# Patient Record
Sex: Male | Born: 1955 | Race: White | Hispanic: No | Marital: Married | Smoking: Never smoker
Health system: Southern US, Community
[De-identification: ages and names within clinical notes are randomized; demographics above are authoritative.]

## PROBLEM LIST (undated history)

## (undated) DIAGNOSIS — K219 Gastro-esophageal reflux disease without esophagitis: Secondary | ICD-10-CM

## (undated) DIAGNOSIS — M1990 Unspecified osteoarthritis, unspecified site: Secondary | ICD-10-CM

## (undated) DIAGNOSIS — G4730 Sleep apnea, unspecified: Secondary | ICD-10-CM

## (undated) DIAGNOSIS — I10 Essential (primary) hypertension: Secondary | ICD-10-CM

## (undated) HISTORY — PX: BACK SURGERY: SHX140

---

## 2001-11-10 ENCOUNTER — Ambulatory Visit (HOSPITAL_COMMUNITY): Admission: RE | Admit: 2001-11-10 | Discharge: 2001-11-11 | Payer: Self-pay | Admitting: Neurosurgery

## 2001-11-10 ENCOUNTER — Encounter: Payer: Self-pay | Admitting: Neurosurgery

## 2004-04-11 ENCOUNTER — Ambulatory Visit (HOSPITAL_COMMUNITY): Admission: RE | Admit: 2004-04-11 | Discharge: 2004-04-12 | Payer: Self-pay | Admitting: Neurosurgery

## 2006-02-07 IMAGING — CR DG CHEST 2V
2 series · 2 of 2 positions shown · non-contrast
Comparison: none

CLINICAL DATA: HNP, spondylosis. Pre-operative chest. 
 CHEST - TWO VIEW ? 04/10/04:
 The lungs are clear and the heart and mediastinal structures are normal.

[view not recorded (1 of 2)]
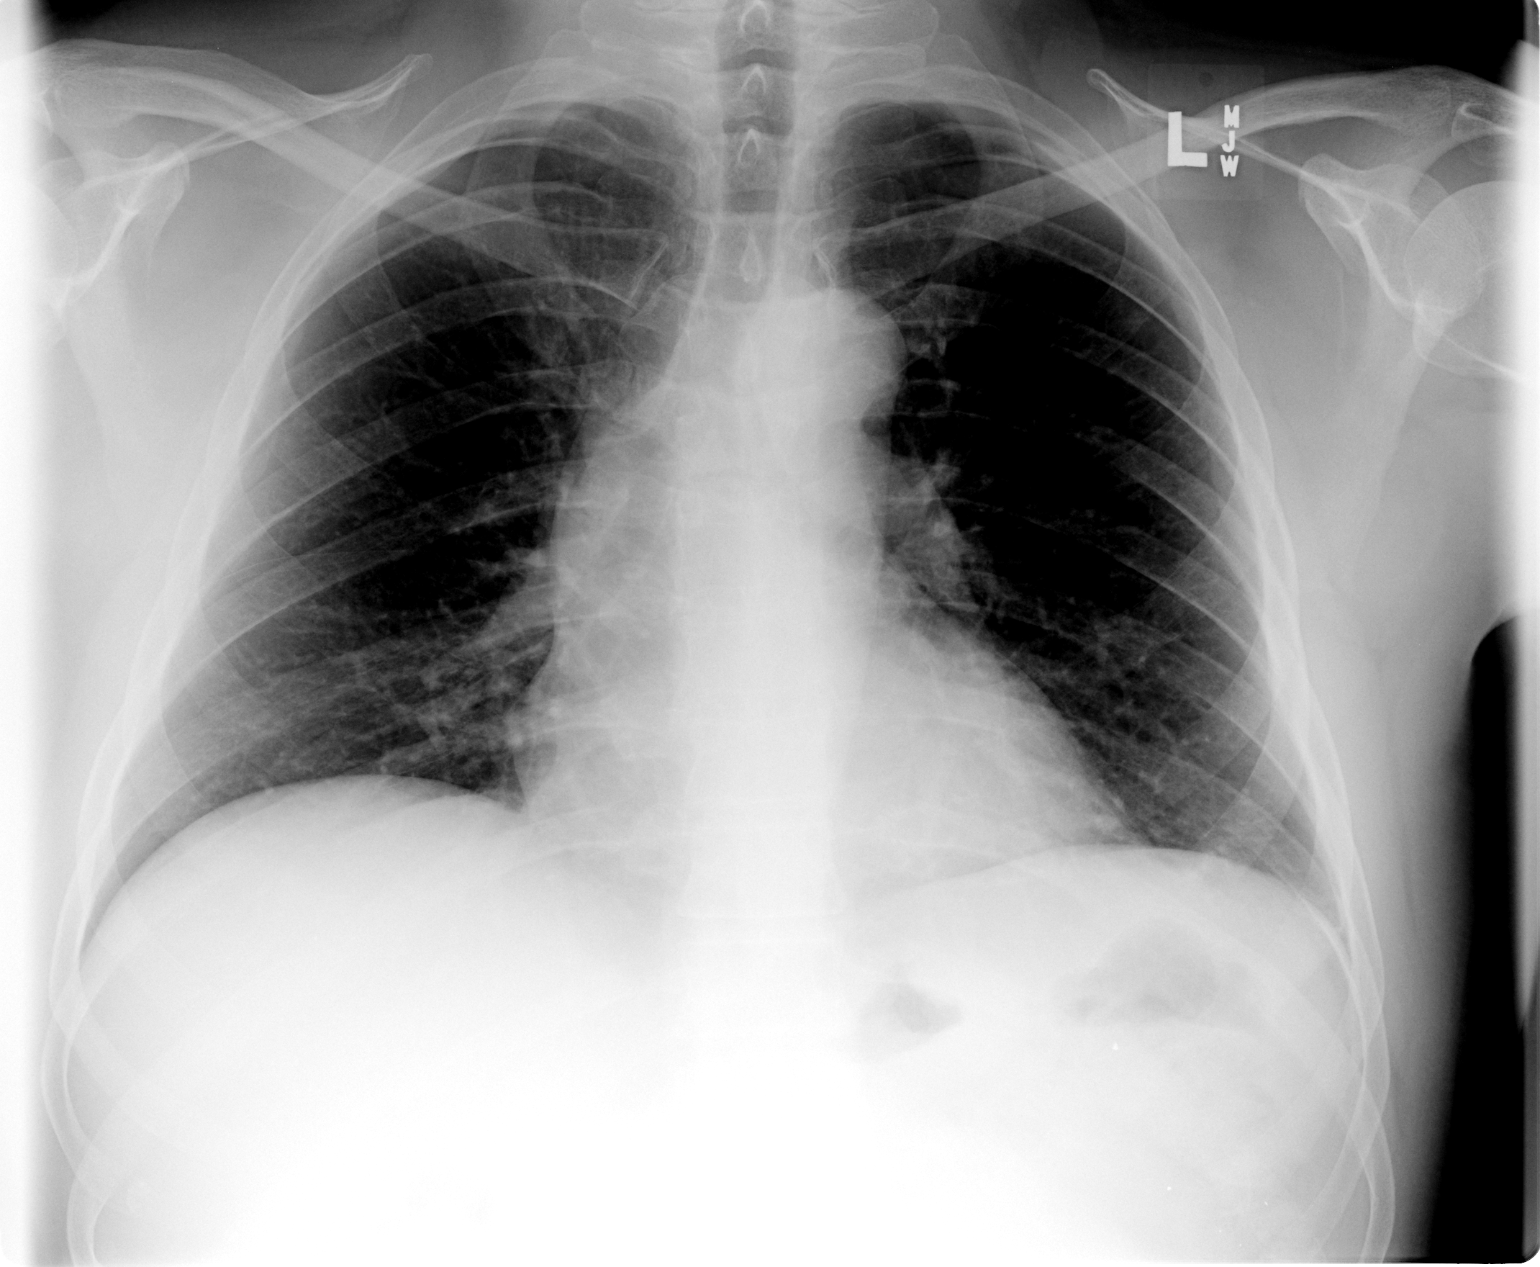

[view not recorded (2 of 2)]
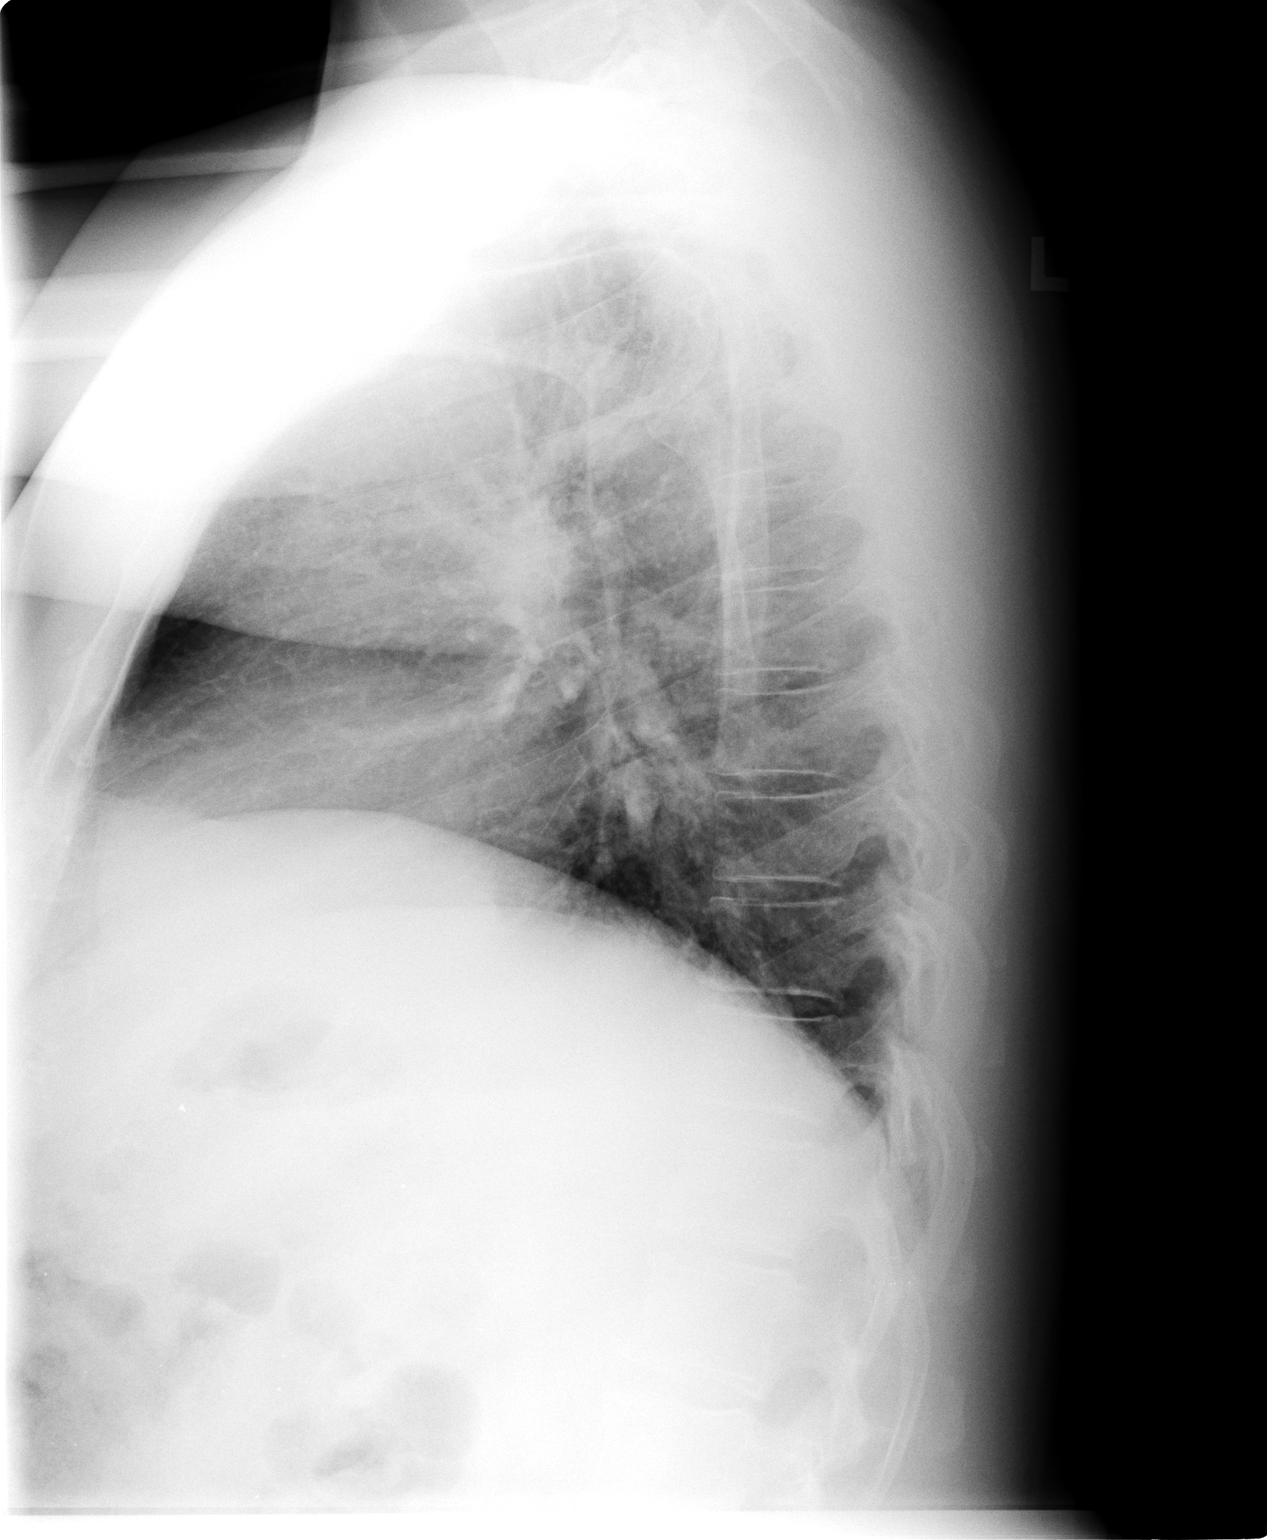

[2 of 2 positions shown; findings below may reference images not displayed]

IMPRESSION: No evidence for active chest disease.

## 2017-09-18 ENCOUNTER — Encounter (INDEPENDENT_AMBULATORY_CARE_PROVIDER_SITE_OTHER): Payer: Self-pay | Admitting: *Deleted

## 2017-10-15 ENCOUNTER — Encounter (INDEPENDENT_AMBULATORY_CARE_PROVIDER_SITE_OTHER): Payer: Self-pay | Admitting: *Deleted

## 2017-10-17 ENCOUNTER — Other Ambulatory Visit (INDEPENDENT_AMBULATORY_CARE_PROVIDER_SITE_OTHER): Payer: Self-pay | Admitting: *Deleted

## 2017-10-17 DIAGNOSIS — Z1211 Encounter for screening for malignant neoplasm of colon: Secondary | ICD-10-CM | POA: Insufficient documentation

## 2017-12-17 ENCOUNTER — Telehealth (INDEPENDENT_AMBULATORY_CARE_PROVIDER_SITE_OTHER): Payer: Self-pay | Admitting: *Deleted

## 2017-12-17 ENCOUNTER — Encounter (INDEPENDENT_AMBULATORY_CARE_PROVIDER_SITE_OTHER): Payer: Self-pay | Admitting: *Deleted

## 2017-12-17 NOTE — Telephone Encounter (Signed)
Patient needs suprep 

## 2017-12-18 MED ORDER — SUPREP BOWEL PREP KIT 17.5-3.13-1.6 GM/177ML PO SOLN: 1.0000 | Freq: Once | ORAL | 0 refills | Status: AC

## 2018-02-19 ENCOUNTER — Encounter (INDEPENDENT_AMBULATORY_CARE_PROVIDER_SITE_OTHER): Payer: Self-pay | Admitting: *Deleted

## 2018-03-10 ENCOUNTER — Telehealth (INDEPENDENT_AMBULATORY_CARE_PROVIDER_SITE_OTHER): Payer: Self-pay | Admitting: *Deleted

## 2018-03-10 NOTE — Telephone Encounter (Signed)
Referring MD/PCP: milam   Procedure: tcs  Reason/Indication:  screening  Has patient had this procedure before?  no  If so, when, by whom and where?    Is there a family history of colon cancer?  no  Who?  What age when diagnosed?    Is patient diabetic?   no      Does patient have prosthetic heart valve or mechanical valve?  no  Do you have a pacemaker?  no  Has patient ever had endocarditis? no  Has patient had joint replacement within last 12 months?  no  Is patient constipated or do they take laxatives? no  Does patient have a history of alcohol/drug use?  no  Is patient on blood thinner such as Coumadin, Plavix and/or Aspirin? no  Medications: hctz 25 mg daily, amlodipine 5 mg daily, carvedilol 12.5 mg daily, omeprazole 20 mg daily  Allergies: nkda  Medication Adjustment per Dr Lindi Adie, NP:   Procedure date & time: 04/10/18 at 730

## 2018-03-13 NOTE — Telephone Encounter (Signed)
agree

## 2018-04-10 ENCOUNTER — Encounter (HOSPITAL_COMMUNITY)
Admission: RE | Disposition: A | Discharge status: Home / Self Care | Payer: Self-pay | Source: Home / Self Care | Attending: Internal Medicine

## 2018-04-10 ENCOUNTER — Other Ambulatory Visit: Payer: Self-pay

## 2018-04-10 ENCOUNTER — Ambulatory Visit (HOSPITAL_COMMUNITY)
Admission: RE | Admit: 2018-04-10 | Discharge: 2018-04-10 | Disposition: A | Discharge status: Home / Self Care | Payer: BLUE CROSS/BLUE SHIELD | Attending: Internal Medicine | Admitting: Internal Medicine

## 2018-04-10 ENCOUNTER — Encounter (HOSPITAL_COMMUNITY): Payer: Self-pay | Admitting: *Deleted

## 2018-04-10 DIAGNOSIS — D120 Benign neoplasm of cecum: Secondary | ICD-10-CM | POA: Insufficient documentation

## 2018-04-10 DIAGNOSIS — K219 Gastro-esophageal reflux disease without esophagitis: Secondary | ICD-10-CM | POA: Insufficient documentation

## 2018-04-10 DIAGNOSIS — Z79899 Other long term (current) drug therapy: Secondary | ICD-10-CM | POA: Insufficient documentation

## 2018-04-10 DIAGNOSIS — I10 Essential (primary) hypertension: Secondary | ICD-10-CM | POA: Insufficient documentation

## 2018-04-10 DIAGNOSIS — G4730 Sleep apnea, unspecified: Secondary | ICD-10-CM | POA: Diagnosis not present

## 2018-04-10 DIAGNOSIS — K648 Other hemorrhoids: Secondary | ICD-10-CM | POA: Insufficient documentation

## 2018-04-10 DIAGNOSIS — Z1211 Encounter for screening for malignant neoplasm of colon: Secondary | ICD-10-CM | POA: Diagnosis present

## 2018-04-10 DIAGNOSIS — K644 Residual hemorrhoidal skin tags: Secondary | ICD-10-CM | POA: Diagnosis not present

## 2018-04-10 DIAGNOSIS — D123 Benign neoplasm of transverse colon: Secondary | ICD-10-CM | POA: Insufficient documentation

## 2018-04-10 DIAGNOSIS — D122 Benign neoplasm of ascending colon: Secondary | ICD-10-CM | POA: Insufficient documentation

## 2018-04-10 HISTORY — DX: Gastro-esophageal reflux disease without esophagitis: K21.9

## 2018-04-10 HISTORY — DX: Sleep apnea, unspecified: G47.30

## 2018-04-10 HISTORY — DX: Unspecified osteoarthritis, unspecified site: M19.90

## 2018-04-10 HISTORY — PX: COLONOSCOPY: SHX5424

## 2018-04-10 HISTORY — PX: POLYPECTOMY: SHX5525

## 2018-04-10 HISTORY — DX: Essential (primary) hypertension: I10

## 2018-04-10 SURGERY — COLONOSCOPY
Anesthesia: Moderate Sedation

## 2018-04-10 MED ORDER — MIDAZOLAM HCL 5 MG/5ML IJ SOLN
INTRAMUSCULAR | Status: AC
  Filled 2018-04-10: qty 10

## 2018-04-10 MED ORDER — MEPERIDINE HCL 50 MG/ML IJ SOLN
INTRAMUSCULAR | Status: DC | PRN
  Administered 2018-04-10 (×2): 25 mg via INTRAVENOUS

## 2018-04-10 MED ORDER — SODIUM CHLORIDE 0.9 % IV SOLN
INTRAVENOUS | Status: DC
  Administered 2018-04-10: 1000 mL via INTRAVENOUS

## 2018-04-10 MED ORDER — MIDAZOLAM HCL 5 MG/5ML IJ SOLN
INTRAMUSCULAR | Status: AC
  Filled 2018-04-10: qty 5

## 2018-04-10 MED ORDER — MIDAZOLAM HCL 5 MG/5ML IJ SOLN
INTRAMUSCULAR | Status: DC | PRN
  Administered 2018-04-10 (×2): 1 mg via INTRAVENOUS
  Administered 2018-04-10: 2 mg via INTRAVENOUS
  Administered 2018-04-10 (×3): 1 mg via INTRAVENOUS
  Administered 2018-04-10 (×2): 2 mg via INTRAVENOUS

## 2018-04-10 MED ORDER — STERILE WATER FOR IRRIGATION IR SOLN
Status: DC | PRN
  Administered 2018-04-10: 08:00:00

## 2018-04-10 MED ORDER — MEPERIDINE HCL 50 MG/ML IJ SOLN
INTRAMUSCULAR | Status: AC
  Filled 2018-04-10: qty 1

## 2018-04-10 NOTE — H&P (Signed)
Bryan Trujillo is an 63 y.o. male.   Chief Complaint: Patient is here for colonoscopy. HPI: Patient 63 year old Caucasian male was here for  screening colonoscopy.  Last exam was normal 10 years ago.  He denies abdominal pain change in bowel habits or rectal bleeding.  Family history is negative for CRC. He does not take on regular basis.  Past Medical History:  Diagnosis Date  . Arthritis    generalized  . GERD (gastroesophageal reflux disease)   . Hypertension   . Sleep apnea     Past Surgical History:  Procedure Laterality Date  . BACK SURGERY      History reviewed. No pertinent family history. Social History:  reports that he has never smoked. He has never used smokeless tobacco. He reports current alcohol use. He reports that he does not use drugs.  Allergies:  Allergies  Allergen Reactions  . Adhesive [Tape] Rash    Medications Prior to Admission  Medication Sig Dispense Refill  . amLODipine (NORVASC) 5 MG tablet Take 5 mg by mouth daily.    . carvedilol (COREG) 12.5 MG tablet Take 12.5 mg by mouth 2 (two) times daily with a meal.    . hydrochlorothiazide (HYDRODIURIL) 25 MG tablet Take 25 mg by mouth daily.    Marland Kitchen ibuprofen (ADVIL,MOTRIN) 200 MG tablet Take 800 mg by mouth every 6 (six) hours as needed for moderate pain.    . Melatonin 10 MG CAPS Take 10 mg by mouth at bedtime.    . Multiple Vitamin (MULTIVITAMIN WITH MINERALS) TABS tablet Take 2 tablets by mouth at bedtime.    Marland Kitchen omeprazole (PRILOSEC) 20 MG capsule Take 20 mg by mouth See admin instructions. Take 20 mg at night may take an additional 20 mg dose as needed for heartburn    . TURMERIC PO Take 1 tablet by mouth daily.      No results found for this or any previous visit (from the past 48 hour(s)). No results found.  ROS  Blood pressure (!) 133/98, pulse 62, temperature 97.9 F (36.6 C), temperature source Oral, resp. rate 15, SpO2 100 %. Physical Exam  Constitutional: He appears well-developed and  well-nourished.  HENT:  Mouth/Throat: Oropharynx is clear and moist.  Eyes: Conjunctivae are normal. No scleral icterus.  Neck: No thyromegaly present.  Cardiovascular: Normal rate, regular rhythm and normal heart sounds.  No murmur heard. Respiratory: Effort normal and breath sounds normal.  GI: He exhibits no distension and no mass. There is no abdominal tenderness.  Musculoskeletal:        General: No edema.  Lymphadenopathy:    He has no cervical adenopathy.  Neurological: He is alert.  Skin: Skin is warm and dry.     Assessment/Plan Average her screening colonoscopy.  Hildred Laser, MD 04/10/2018, 7:33 AM

## 2018-04-10 NOTE — Op Note (Signed)
[facility] Patient Name: Bryan Trujillo Procedure Date: 04/10/2018 7:16 AM MRN: [id number] Date of Birth: 1955-03-22 Attending MD: Hildred Laser , MD CSN: [id number] Age: 63 Admit Type: Outpatient Procedure:                Colonoscopy Indications:              Screening for colorectal malignant neoplasm Providers:                Hildred Laser, MD, Otis Peak B. Sharon Seller, RN, Gerome Sam, RN, Randa Spike, Technician Referring MD:             Quentin Cornwall, MD Medicines:                Meperidine 50 mg IV, Midazolam 11 mg IV Complications:            No immediate complications. Estimated Blood Loss:     Estimated blood loss was minimal. Procedure:                Pre-Anesthesia Assessment:                           - Prior to the procedure, a History and Physical                            was performed, and patient medications and                            allergies were reviewed. The patient's tolerance of                            previous anesthesia was also reviewed. The risks                            and benefits of the procedure and the sedation                            options and risks were discussed with the patient.                            All questions were answered, and informed consent                            was obtained. Prior Anticoagulants: The patient has                            taken no previous anticoagulant or antiplatelet                            agents. ASA Grade Assessment: II - A patient with                            mild systemic disease. After reviewing the risks  and benefits, the patient was deemed in                            satisfactory condition to undergo the procedure.                           After obtaining informed consent, the colonoscope                            was passed under direct vision. Throughout the                            procedure, the patient's  blood pressure, pulse, and                            oxygen saturations were monitored continuously. The                            [id number]-H190DL ([id number]) scope was introduced through                            the anus and advanced to the the cecum, identified                            by appendiceal orifice and ileocecal valve. The                            colonoscopy was performed without difficulty. The                            patient tolerated the procedure well. The quality                            of the bowel preparation was good. The ileocecal                            valve, appendiceal orifice, and rectum were                            photographed. Scope In: 7:44:20 AM Scope Out: 8:16:55 AM Scope Withdrawal Time: 0 hours 19 minutes 10 seconds  Total Procedure Duration: 0 hours 32 minutes 35 seconds  Findings:      Skin tags were found on perianal exam.      A 7 mm polyp was found in the cecum. The polyp was semi-sessile. The       polyp was removed with a cold snare. Resection and retrieval were       complete. The pathology specimen was placed into Bottle Number 2.      A small polyp was found in the cecum. The polyp was sessile. Biopsies       were taken with a cold forceps for histology. The pathology specimen was       placed into Bottle Number 2.      Six sessile polyps were found in the transverse colon and ascending  colon. The polyps were small in size. These polyps were removed with a       cold snare. Resection and retrieval were complete. The pathology       specimen was placed into Bottle Number 1.      A small polyp was found in the ascending colon. The polyp was sessile.       Biopsies were taken with a cold forceps for histology. The pathology       specimen was placed into Bottle Number 1.      External and internal hemorrhoids were found during retroflexion. The       hemorrhoids were medium-sized. Impression:               - Perianal skin  tags found on perianal exam.                           - One 7 mm polyp in the cecum, removed with a cold                            snare. Resected and retrieved.                           - One small polyp in the cecum. Biopsied.                           - Six small polyps in the transverse colon and in                            the ascending colon, removed with a cold snare.                            Resected and retrieved.                           - One small polyp in the ascending colon. Biopsied.                           - External and internal hemorrhoids. Moderate Sedation:      Moderate (conscious) sedation was administered by the endoscopy nurse       and supervised by the endoscopist. The following parameters were       monitored: oxygen saturation, heart rate, blood pressure, CO2       capnography and response to care. Total physician intraservice time was       39 minutes. Recommendation:           - Patient has a contact number available for                            emergencies. The signs and symptoms of potential                            delayed complications were discussed with the                            patient. Return to normal activities tomorrow.  Written discharge instructions were provided to the                            patient.                           - Resume previous diet today.                           - Continue present medications.                           - No aspirin, ibuprofen, naproxen, or other                            non-steroidal anti-inflammatory drugs for 3 days.                           - Await pathology results.                           - Repeat colonoscopy in 3 years for surveillance. Procedure Code(s):        --- Professional ---                           [phone], Colonoscopy, flexible; with removal of                            tumor(s), polyp(s), or other lesion(s) by snare                             technique                           45380, 59, Colonoscopy, flexible; with biopsy,                            single or multiple                           99153, Moderate sedation; each additional 15                            minutes intraservice time                           99153, Moderate sedation; each additional 15                            minutes intraservice time                           G0500, Moderate sedation services provided by the                            same physician or other qualified health care  professional performing a gastrointestinal                            endoscopic service that sedation supports,                            requiring the presence of an independent trained                            observer to assist in the monitoring of the                            patient's level of consciousness and physiological                            status; initial 15 minutes of intra-service time;                            patient age 15 years or older (additional time may                            be reported with [phone], as appropriate) Diagnosis Code(s):        --- Professional ---                           Z12.11, Encounter for screening for malignant                            neoplasm of colon                           D12.0, Benign neoplasm of cecum                           D12.2, Benign neoplasm of ascending colon                           D12.3, Benign neoplasm of transverse colon (hepatic                            flexure or splenic flexure)                           K64.4, Residual hemorrhoidal skin tags                           K64.8, Other hemorrhoids CPT copyright 2018 American [employer]. All rights reserved. The codes documented in this report are preliminary and upon coder review may  be revised to meet current compliance requirements. Hildred Laser, MD Hildred Laser, MD 04/10/2018 8:30:04 AM This  report has been signed electronically. Number of Addenda: 0

## 2018-04-10 NOTE — Discharge Instructions (Signed)
No aspirin or NSAIDs for 72 hours. Resume other medications as before. Resume usual diet. No driving for 24 hours. Physician will call with biopsy results.   Colonoscopy, Adult, Care After This sheet gives you information about how to care for yourself after your procedure. Your doctor may also give you more specific instructions. If you have problems or questions, call your doctor. What can I expect after the procedure? After the procedure, it is common to have:  A small amount of blood in your poop for 24 hours.  Some gas.  Mild cramping or bloating in your belly. Follow these instructions at home: General instructions  For the first 24 hours after the procedure: ? Do not drive or use machinery. ? Do not sign important documents. ? Do not drink alcohol. ? Do your daily activities more slowly than normal. ? Eat foods that are soft and easy to digest.  Take over-the-counter or prescription medicines only as told by your doctor. To help cramping and bloating:   Try walking around.  Put heat on your belly (abdomen) as told by your doctor. Use a heat source that your doctor recommends, such as a moist heat pack or a heating pad. ? Put a towel between your skin and the heat source. ? Leave the heat on for 20-30 minutes. ? Remove the heat if your skin turns bright red. This is especially important if you cannot feel pain, heat, or cold. You can get burned. Eating and drinking   Drink enough fluid to keep your pee (urine) clear or pale yellow.  Return to your normal diet as told by your doctor. Avoid heavy or fried foods that are hard to digest.  Avoid drinking alcohol for as long as told by your doctor. Contact a doctor if:  You have blood in your poop (stool) 2-3 days after the procedure. Get help right away if:  You have more than a small amount of blood in your poop.  You see large clumps of tissue (blood clots) in your poop.  Your belly is swollen.  You feel  sick to your stomach (nauseous).  You throw up (vomit).  You have a fever.  You have belly pain that gets worse, and medicine does not help your pain. Summary  After the procedure, it is common to have a small amount of blood in your poop. You may also have mild cramping and bloating in your belly.  For the first 24 hours after the procedure, do not drive or use machinery, do not sign important documents, and do not drink alcohol.  Get help right away if you have a lot of blood in your poop, feel sick to your stomach, have a fever, or have more belly pain. This information is not intended to replace advice given to you by your health care provider. Make sure you discuss any questions you have with your health care provider. Document Released: 04/07/2010 Document Revised: 01/03/2017 Document Reviewed: 11/28/2015 Elsevier Interactive Patient Education  2019 [employer]   Hemorrhoids Hemorrhoids are swollen veins that may develop:  In the butt (rectum). These are called internal hemorrhoids.  Around the opening of the butt (anus). These are called external hemorrhoids. Hemorrhoids can cause pain, itching, or bleeding. Most of the time, they do not cause serious problems. They usually get better with diet changes, lifestyle changes, and other home treatments. What are the causes? This condition may be caused by:  Having trouble pooping (constipation).  Pushing hard (straining) to poop.  Watery poop (diarrhea).  Pregnancy.  Being very overweight (obese).  Sitting for long periods of time.  Heavy lifting or other activity that causes you to strain.  Anal sex.  Riding a bike for a long period of time. What are the signs or symptoms? Symptoms of this condition include:  Pain.  Itching or soreness in the butt.  Bleeding from the butt.  Leaking poop.  Swelling in the area.  One or more lumps around the opening of your butt. How is this diagnosed? A doctor can  often diagnose this condition by looking at the affected area. The doctor may also:  Do an exam that involves feeling the area with a gloved hand (digital rectal exam).  Examine the area inside your butt using a small tube (anoscope).  Order blood tests. This may be done if you have lost a lot of blood.  Have you get a test that involves looking inside the colon using a flexible tube with a camera on the end (sigmoidoscopy or colonoscopy). How is this treated? This condition can usually be treated at home. Your doctor may tell you to change what you eat, make lifestyle changes, or try home treatments. If these do not help, procedures can be done to remove the hemorrhoids or make them smaller. These may involve:  Placing rubber bands at the base of the hemorrhoids to cut off their blood supply.  Injecting medicine into the hemorrhoids to shrink them.  Shining a type of light energy onto the hemorrhoids to cause them to fall off.  Doing surgery to remove the hemorrhoids or cut off their blood supply. Follow these instructions at home: Eating and drinking   Eat foods that have a lot of fiber in them. These include whole grains, beans, nuts, fruits, and vegetables.  Ask your doctor about taking products that have added fiber (fibersupplements).  Reduce the amount of fat in your diet. You can do this by: ? Eating low-fat dairy products. ? Eating less red meat. ? Avoiding processed foods.  Drink enough fluid to keep your pee (urine) pale yellow. Managing pain and swelling   Take a warm-water bath (sitz bath) for 20 minutes to ease pain. Do this 3-4 times a day. You may do this in a bathtub or using a portable sitz bath that fits over the toilet.  If told, put ice on the painful area. It may be helpful to use ice between your warm baths. ? Put ice in a plastic bag. ? Place a towel between your skin and the bag. ? Leave the ice on for 20 minutes, 2-3 times a day. General  instructions  Take over-the-counter and prescription medicines only as told by your doctor. ? Medicated creams and medicines may be used as told.  Exercise often. Ask your doctor how much and what kind of exercise is best for you.  Go to the bathroom when you have the urge to poop. Do not wait.  Avoid pushing too hard when you poop.  Keep your butt dry and clean. Use wet toilet paper or moist towelettes after pooping.  Do not sit on the toilet for a long time.  Keep all follow-up visits as told by your doctor. This is important. Contact a doctor if you:  Have pain and swelling that do not get better with treatment or medicine.  Have trouble pooping.  Cannot poop.  Have pain or swelling outside the area of the hemorrhoids. Get help right away if you have:  Bleeding that will not stop. Summary  Hemorrhoids are swollen veins in the butt or around the opening of the butt.  They can cause pain, itching, or bleeding.  Eat foods that have a lot of fiber in them. These include whole grains, beans, nuts, fruits, and vegetables.  Take a warm-water bath (sitz bath) for 20 minutes to ease pain. Do this 3-4 times a day. This information is not intended to replace advice given to you by your health care provider. Make sure you discuss any questions you have with your health care provider. Document Released: 12/13/2007 Document Revised: 07/25/2017 Document Reviewed: 07/25/2017 Elsevier Interactive Patient Education  2019 [place] POST-ANESTHESIA  IMMEDIATELY FOLLOWING SURGERY:  Do not drive or operate machinery for the first twenty four hours after surgery.  Do not make any important decisions for twenty four hours after surgery or while taking narcotic pain medications or sedatives.  If you develop intractable nausea and vomiting or a severe headache please notify your doctor immediately.  FOLLOW-UP:  Please make an appointment with your surgeon as  instructed. You do not need to follow up with anesthesia unless specifically instructed to do so.  WOUND CARE INSTRUCTIONS (if applicable):  Keep a dry clean dressing on the anesthesia/puncture wound site if there is drainage.  Once the wound has quit draining you may leave it open to air.  Generally you should leave the bandage intact for twenty four hours unless there is drainage.  If the epidural site drains for more than 36-48 hours please call the anesthesia department.  QUESTIONS?:  Please feel free to call your physician or the hospital operator if you have any questions, and they will be happy to assist you.

## 2018-04-14 ENCOUNTER — Encounter (HOSPITAL_COMMUNITY): Payer: Self-pay | Admitting: Internal Medicine

## 2021-03-14 ENCOUNTER — Encounter (INDEPENDENT_AMBULATORY_CARE_PROVIDER_SITE_OTHER): Payer: Self-pay | Admitting: *Deleted

## 2023-12-03 ENCOUNTER — Telehealth (INDEPENDENT_AMBULATORY_CARE_PROVIDER_SITE_OTHER): Payer: Self-pay

## 2023-12-03 NOTE — Telephone Encounter (Signed)
Ok to schedule.  Room : any   Thanks,  Vista Lawman, MD Gastroenterology and Hepatology [facility]

## 2023-12-03 NOTE — Telephone Encounter (Signed)
 ATC pt, no answer. LVM for call back.

## 2023-12-03 NOTE — Telephone Encounter (Signed)
 Who is your primary care physician: Dr. Lynwood IVAR Moll  Reasons for the colonoscopy: history colon polyps  Have you had a colonoscopy before?  Yes, 2019  Do you have family history of colon cancer? no  Previous colonoscopy with polyps removed? yes  Do you have a history colorectal cancer?   no  Are you diabetic? If yes, Type 1 or Type 2?    Yes, type 2  Do you have a prosthetic or mechanical heart valve? no  Do you have a pacemaker/defibrillator?   no  Have you had endocarditis/atrial fibrillation? no  Have you had joint replacement within the last 12 months?  no  Do you tend to be constipated or have to use laxatives? no  Do you have any history of drugs or alchohol?  no  Do you use supplemental oxygen?  no  Have you had a stroke or heart attack within the last 6 months? no  Do you take weight loss medication?  no  Do you take any blood-thinning medications such as: (aspirin, warfarin, Plavix, Aggrenox)  no  If yes we need the name, milligram, dosage and who is prescribing doctor  Current Outpatient Medications on File Prior to Visit  Medication Sig Dispense Refill   amLODipine (NORVASC) 5 MG tablet Take 5 mg by mouth daily.     carvedilol (COREG) 12.5 MG tablet Take 12.5 mg by mouth 2 (two) times daily with a meal.     hydrochlorothiazide (HYDRODIURIL) 25 MG tablet Take 25 mg by mouth daily.     omeprazole (PRILOSEC) 20 MG capsule Take 20 mg by mouth See admin instructions. Take 20 mg at night may take an additional 20 mg dose as needed for heartburn     rosuvastatin (CRESTOR) 10 MG tablet Take 10 mg by mouth daily.     sitaGLIPtin-metformin (JANUMET) 50-1000 MG tablet Take 1 tablet by mouth 2 (two) times daily with a meal.     No current facility-administered medications on file prior to visit.    Allergies  Allergen Reactions   Adhesive [Tape] Rash     Pharmacy: [facility]  Primary Insurance Name: [facility]  Best number where you can be reached:  [phone] or [phone]

## 2023-12-04 NOTE — Telephone Encounter (Signed)
 Patient requested we call with our November schedule.

## 2023-12-19 NOTE — Telephone Encounter (Signed)
 ATC pt, no answer. LVM for call back.

## 2024-01-14 ENCOUNTER — Encounter: Payer: Self-pay | Admitting: *Deleted

## 2024-01-14 ENCOUNTER — Other Ambulatory Visit: Payer: Self-pay | Admitting: *Deleted

## 2024-01-14 MED ORDER — PEG 3350-KCL-NA BICARB-NACL 420 G PO SOLR: 4000.0000 mL | Freq: Once | ORAL | 0 refills | Status: AC

## 2024-01-14 NOTE — Telephone Encounter (Signed)
 Pt has been scheduled for 01/31/24. Instructions was given to pt and prep sent to pharmacy.

## 2024-01-15 NOTE — Telephone Encounter (Signed)
 Questionnaire from recall, no referral needed

## 2024-01-27 ENCOUNTER — Encounter (HOSPITAL_COMMUNITY)
Admission: RE | Admit: 2024-01-27 | Discharge: 2024-01-27 | Disposition: A | Discharge status: Home / Self Care | Source: Ambulatory Visit | Attending: Gastroenterology | Admitting: Gastroenterology

## 2024-01-28 ENCOUNTER — Encounter (HOSPITAL_COMMUNITY): Payer: Self-pay

## 2024-01-28 ENCOUNTER — Other Ambulatory Visit: Payer: Self-pay

## 2024-01-31 ENCOUNTER — Ambulatory Visit (HOSPITAL_COMMUNITY): Admitting: Anesthesiology

## 2024-01-31 ENCOUNTER — Ambulatory Visit (HOSPITAL_COMMUNITY)
Admission: RE | Admit: 2024-01-31 | Discharge: 2024-01-31 | Disposition: A | Discharge status: Home / Self Care | Attending: Gastroenterology | Admitting: Gastroenterology

## 2024-01-31 ENCOUNTER — Encounter (HOSPITAL_COMMUNITY): Payer: Self-pay | Admitting: Gastroenterology

## 2024-01-31 ENCOUNTER — Encounter (HOSPITAL_COMMUNITY): Admission: RE | Disposition: A | Payer: Self-pay | Source: Home / Self Care | Attending: Gastroenterology

## 2024-01-31 DIAGNOSIS — K635 Polyp of colon: Secondary | ICD-10-CM

## 2024-01-31 DIAGNOSIS — I10 Essential (primary) hypertension: Secondary | ICD-10-CM | POA: Insufficient documentation

## 2024-01-31 DIAGNOSIS — G4730 Sleep apnea, unspecified: Secondary | ICD-10-CM | POA: Diagnosis not present

## 2024-01-31 DIAGNOSIS — Z1211 Encounter for screening for malignant neoplasm of colon: Secondary | ICD-10-CM | POA: Insufficient documentation

## 2024-01-31 DIAGNOSIS — Z860101 Personal history of adenomatous and serrated colon polyps: Secondary | ICD-10-CM

## 2024-01-31 DIAGNOSIS — K648 Other hemorrhoids: Secondary | ICD-10-CM | POA: Insufficient documentation

## 2024-01-31 DIAGNOSIS — D124 Benign neoplasm of descending colon: Secondary | ICD-10-CM | POA: Diagnosis not present

## 2024-01-31 DIAGNOSIS — D125 Benign neoplasm of sigmoid colon: Secondary | ICD-10-CM | POA: Diagnosis not present

## 2024-01-31 HISTORY — PX: COLONOSCOPY: SHX5424

## 2024-01-31 LAB — HM COLONOSCOPY

## 2024-01-31 LAB — GLUCOSE, CAPILLARY: Glucose-Capillary: 111 mg/dL — ABNORMAL HIGH (ref 70–99)

## 2024-01-31 SURGERY — COLONOSCOPY
Anesthesia: Monitor Anesthesia Care

## 2024-01-31 MED ORDER — PROPOFOL 500 MG/50ML IV EMUL
INTRAVENOUS | Status: DC | PRN
  Administered 2024-01-31: 150 ug/kg/min via INTRAVENOUS

## 2024-01-31 MED ORDER — LIDOCAINE 2% (20 MG/ML) 5 ML SYRINGE
INTRAMUSCULAR | Status: DC | PRN
  Administered 2024-01-31: 50 mg via INTRAVENOUS

## 2024-01-31 MED ORDER — LACTATED RINGERS IV SOLN: INTRAVENOUS | Status: DC

## 2024-01-31 MED ORDER — PROPOFOL 10 MG/ML IV BOLUS
INTRAVENOUS | Status: DC | PRN
  Administered 2024-01-31: 80 mg via INTRAVENOUS

## 2024-01-31 NOTE — Anesthesia Postprocedure Evaluation (Signed)
 Anesthesia Post Note  Patient: Bryan Trujillo  Procedure(s) Performed: COLONOSCOPY  Patient location during evaluation: Phase II Anesthesia Type: MAC Level of consciousness: awake Pain management: pain level controlled Vital Signs Assessment: post-procedure vital signs reviewed and stable Respiratory status: spontaneous breathing and respiratory function stable Cardiovascular status: blood pressure returned to baseline and stable Postop Assessment: no headache and no apparent nausea or vomiting Anesthetic complications: no Comments: Late entry   No notable events documented.   Last Vitals:  Vitals:   01/31/24 1144 01/31/24 1150  BP:  124/81  Pulse:    Resp:  19  Temp:    SpO2: 95% 97%    Last Pain:  Vitals:   01/31/24 1206  TempSrc:   PainSc: 6                  Yvonna JINNY Bosworth

## 2024-01-31 NOTE — Progress Notes (Signed)
 Patient c/o abdomen pain post procedure.  Dr. Cinderella saw patient, states he is distended, will come back and assess patient again before d/c home.  Assisted patient to get dressed and took patient to the bathroom.  Encouraged patient to pass gas.  Patient did pass a lot of gas, about 4-5 times while in the restroom.  Patient states he is feeling better after walking around and passing gas.  Dr. Cinderella saw patient again after he used restroom and passed gas.  Dr. Cinderella states he is softer now, okay for d/c home.

## 2024-01-31 NOTE — Op Note (Signed)
 [facility] Patient Name: Bryan Trujillo Procedure Date: 01/31/2024 10:59 AM MRN: [id number] Date of Birth: 1955-12-10 Attending MD: Deatrice Dine , MD, [id number] CSN: [id number] Age: 68 Admit Type: Outpatient Procedure:                Colonoscopy Indications:              High risk colon cancer surveillance: Personal                            history of colonic polyps Providers:                Deatrice Dine, MD, Devere Lodge, Leandrew Edelman RN, RN Referring MD:              Medicines:                Monitored Anesthesia Care Complications:            No immediate complications. Estimated Blood Loss:     Estimated blood loss was minimal. Procedure:                Pre-Anesthesia Assessment:                           - Prior to the procedure, a History and Physical                            was performed, and patient medications and                            allergies were reviewed. The patient's tolerance of                            previous anesthesia was also reviewed. The risks                            and benefits of the procedure and the sedation                            options and risks were discussed with the patient.                            All questions were answered, and informed consent                            was obtained. Prior Anticoagulants: The patient has                            taken no anticoagulant or antiplatelet agents                            except for aspirin. ASA Grade Assessment: II - A                            patient with mild systemic disease. After reviewing  the risks and benefits, the patient was deemed in                            satisfactory condition to undergo the procedure.                           After obtaining informed consent, the colonoscope                            was passed under direct vision. Throughout the                            procedure, the patient's blood pressure, pulse,  and                            oxygen saturations were monitored continuously. The                            CH-HQ190L ([id number]) Colon was introduced through                            the anus and advanced to the the cecum, identified                            by appendiceal orifice and ileocecal valve. The                            colonoscopy was somewhat difficult due to                            significant looping. The quality of the bowel                            preparation was evaluated using the BBPS [place]                            Bowel Preparation Scale) with scores of: Right                            Colon = 3, Transverse Colon = 3 and Left Colon = 3                            (entire mucosa seen well with no residual staining,                            small fragments of stool or opaque liquid). The                            total BBPS score equals 9. The ileocecal valve,                            appendiceal orifice, and rectum were photographed. Scope In: 11:14:20 AM Scope Out: 11:38:20 AM Scope  Withdrawal Time: 0 hours 19 minutes 5 seconds  Total Procedure Duration: 0 hours 24 minutes 0 seconds  Findings:      A 5 mm polyp was found in the descending colon. The polyp was sessile.       The polyp was removed with a cold snare. Resection and retrieval were       complete.      A 7 mm polyp was found in the sigmoid colon. The polyp was sessile. The       polyp was removed with a cold snare. Resection and retrieval were       complete.      Non-bleeding internal hemorrhoids were found during retroflexion. The       hemorrhoids were small. Impression:               - One 5 mm polyp in the descending colon, removed                            with a cold snare. Resected and retrieved.                           - One 7 mm polyp in the sigmoid colon, removed with                            a cold snare. Resected and retrieved.                           -  Non-bleeding internal hemorrhoids. Moderate Sedation:      Per Anesthesia Care Recommendation:           - Patient has a contact number available for                            emergencies. The signs and symptoms of potential                            delayed complications were discussed with the                            patient. Return to normal activities tomorrow.                            Written discharge instructions were provided to the                            patient.                           - Resume previous diet.                           - Continue present medications.                           - Await pathology results.                           - Repeat colonoscopy  in 5 years for surveillance                            based on pathology results given large polyp burden                            last time .                           - Return to primary care physician as previously                            scheduled. Procedure Code(s):        --- Professional ---                           [phone], Colonoscopy, flexible; with removal of                            tumor(s), polyp(s), or other lesion(s) by snare                            technique Diagnosis Code(s):        --- Professional ---                           Z86.010, Personal history of colonic polyps                           D12.4, Benign neoplasm of descending colon                           D12.5, Benign neoplasm of sigmoid colon                           K64.8, Other hemorrhoids CPT copyright 2022 American [employer] Association. All rights reserved. The codes documented in this report are preliminary and upon coder review may  be revised to meet current compliance requirements. Deatrice Dine, MD Deatrice Dine, MD 01/31/2024 11:43:08 AM This report has been signed electronically. Number of Addenda: 0

## 2024-01-31 NOTE — Discharge Instructions (Signed)

## 2024-01-31 NOTE — H&P (Signed)
 Primary Care Physician:  Harvey Lynwood DASEN, MD Primary Gastroenterologist:  Dr. Cinderella  Pre-Procedure History & Physical: HPI:  Bryan Trujillo is a 68 y.o. male is here for a colonoscopy for colon polyp surveillance .  Patient denies any family history of colorectal cancer.  No melena or hematochezia.  No abdominal pain or unintentional weight loss.  No change in bowel habits.  Overall feels well from a GI standpoint.  2020 colonoscopy  Polyps are tubular adenomas and sessile serrated polyps.  One polyp may be normal tissue or hyperplastic polyp.  He had 9 polyps removed.   Past Medical History:  Diagnosis Date   Arthritis    generalized   GERD (gastroesophageal reflux disease)    Hypertension    Sleep apnea     Past Surgical History:  Procedure Laterality Date   BACK SURGERY     COLONOSCOPY N/A 04/10/2018   Procedure: COLONOSCOPY;  Surgeon: Golda Claudis PENNER, MD;  Location: [facility];  Service: Endoscopy;  Laterality: N/A;  730   POLYPECTOMY  04/10/2018   Procedure: POLYPECTOMY;  Surgeon: Golda Claudis PENNER, MD;  Location: [facility];  Service: Endoscopy;;  colon    Prior to Admission medications   Medication Sig Start Date End Date Taking? Authorizing Provider  amLODipine (NORVASC) 5 MG tablet Take 5 mg by mouth daily.   Yes [provider]  carvedilol (COREG) 12.5 MG tablet Take 12.5 mg by mouth 2 (two) times daily with a meal.   Yes [provider]  hydrochlorothiazide (HYDRODIURIL) 25 MG tablet Take 25 mg by mouth daily.   Yes [provider]  omeprazole (PRILOSEC) 20 MG capsule Take 20 mg by mouth See admin instructions. Take 20 mg at night may take an additional 20 mg dose as needed for heartburn   Yes [provider]  rosuvastatin (CRESTOR) 10 MG tablet Take 10 mg by mouth daily.   Yes [provider]  sitaGLIPtin-metformin (JANUMET) 50-1000 MG tablet Take 1 tablet by mouth 2 (two) times daily with a meal.    [provider]    Allergies as of 01/14/2024 - Review Complete 12/03/2023  Allergen Reaction Noted   Adhesive [tape] Rash 04/03/2018    History reviewed. No pertinent family history.  Social History   Socioeconomic History   Marital status: Married    Spouse name: Not on file   Number of children: Not on file   Years of education: Not on file   Highest education level: Not on file  Occupational History   Not on file  Tobacco Use   Smoking status: Never   Smokeless tobacco: Never  Vaping Use   Vaping status: Never Used  Substance and Sexual Activity   Alcohol use: Yes    Comment: rarely   Drug use: Never   Sexual activity: Not on file  Other Topics Concern   Not on file  Social History Narrative   Not on file   Social Drivers of Health   Financial Resource Strain: Not on file  Food Insecurity: Not on file  Transportation Needs: Not on file  Physical Activity: Not on file  Stress: Not on file  Social Connections: Not on file  Intimate Partner Violence: Not on file    Review of Systems: See HPI, otherwise negative ROS  Physical Exam: Vital signs in last 24 hours:     General:   Alert,  Well-developed, well-nourished, pleasant and cooperative in NAD Head:  Normocephalic and atraumatic. Eyes:  Sclera clear, no icterus.   Conjunctiva pink. Ears:  Normal auditory acuity. Nose:  No deformity, discharge,  or lesions. Msk:  Symmetrical without gross deformities. Normal posture. Extremities:  Without clubbing or edema. Neurologic:  Alert and  oriented x4;  grossly normal neurologically. Skin:  Intact without significant lesions or rashes. Psych:  Alert and cooperative. Normal mood and affect.  Impression/Plan: Bryan Trujillo is a 68 y.o. male is here for a colonoscopy for colon polyp surveillance  The risks of the procedure including infection, bleed, or perforation as well as benefits, limitations, alternatives and imponderables have been reviewed with the  patient. Questions have been answered. All parties agreeable.

## 2024-01-31 NOTE — Transfer of Care (Signed)
 Immediate Anesthesia Transfer of Care Note  Patient: Bryan Trujillo  Procedure(s) Performed: COLONOSCOPY  Patient Location: Short Stay  Anesthesia Type:General  Level of Consciousness: drowsy  Airway & Oxygen Therapy: Patient Spontanous Breathing and Patient connected to nasal cannula oxygen  Post-op Assessment: Report given to RN and Post -op Vital signs reviewed and stable  Post vital signs: Reviewed and stable  Last Vitals:  Vitals Value Taken Time  BP 104/50 01/31/24 11:41  Temp 36.6 C 01/31/24 11:41  Pulse 66 01/31/24 11:41  Resp 17 01/31/24 11:41  SpO2 95 % 01/31/24 11:44    Last Pain:  Vitals:   01/31/24 1141  TempSrc: Oral  PainSc: Asleep         Complications: No notable events documented.

## 2024-01-31 NOTE — Anesthesia Preprocedure Evaluation (Signed)
 Anesthesia Evaluation  Patient identified by MRN, date of birth, ID band Patient awake    Reviewed: Allergy & Precautions, H&P , NPO status , Patient's Chart, lab work & pertinent test results, reviewed documented beta blocker date and time   Airway Mallampati: II  TM Distance: >3 FB Neck ROM: full    Dental no notable dental hx.    Pulmonary neg pulmonary ROS, sleep apnea    Pulmonary exam normal breath sounds clear to auscultation       Cardiovascular Exercise Tolerance: Good hypertension, negative cardio ROS  Rhythm:regular Rate:Normal     Neuro/Psych negative neurological ROS  negative psych ROS   GI/Hepatic negative GI ROS, Neg liver ROS,GERD  ,,  Endo/Other  negative endocrine ROS    Renal/GU negative Renal ROS  negative genitourinary   Musculoskeletal  (+) Arthritis ,    Abdominal   Peds  Hematology negative hematology ROS (+)   Anesthesia Other Findings   Reproductive/Obstetrics negative OB ROS                              Anesthesia Physical Anesthesia Plan  ASA: 3  Anesthesia Plan: MAC   Post-op Pain Management:    Induction:   PONV Risk Score and Plan: Propofol infusion  Airway Management Planned:   Additional Equipment:   Intra-op Plan:   Post-operative Plan:   Informed Consent: I have reviewed the patients History and Physical, chart, labs and discussed the procedure including the risks, benefits and alternatives for the proposed anesthesia with the patient or authorized representative who has indicated his/her understanding and acceptance.     Dental Advisory Given  Plan Discussed with: CRNA  Anesthesia Plan Comments:         Anesthesia Quick Evaluation

## 2024-02-03 ENCOUNTER — Ambulatory Visit (INDEPENDENT_AMBULATORY_CARE_PROVIDER_SITE_OTHER): Payer: Self-pay | Admitting: Gastroenterology

## 2024-02-03 ENCOUNTER — Encounter (HOSPITAL_COMMUNITY): Payer: Self-pay | Admitting: Gastroenterology

## 2024-02-03 ENCOUNTER — Encounter (INDEPENDENT_AMBULATORY_CARE_PROVIDER_SITE_OTHER): Payer: Self-pay | Admitting: *Deleted

## 2024-02-03 LAB — SURGICAL PATHOLOGY

## 2024-02-06 NOTE — Progress Notes (Signed)
 5 yr TCS noted in recall Patient result letter mailed procedure note and pathology result faxed to PCP
